# Patient Record
Sex: Male | Born: 1970 | Race: White | Hispanic: No | Marital: Single | Smoking: Current every day smoker
Health system: Southern US, Community
[De-identification: ages and names within clinical notes are randomized; demographics above are authoritative.]

---

## 2016-05-29 ENCOUNTER — Emergency Department
Admission: EM | Admit: 2016-05-29 | Discharge: 2016-05-29 | Disposition: A | Discharge status: Home / Self Care | Payer: Self-pay | Attending: Emergency Medicine | Admitting: Emergency Medicine

## 2016-05-29 ENCOUNTER — Encounter: Payer: Self-pay | Admitting: *Deleted

## 2016-05-29 ENCOUNTER — Emergency Department: Payer: Self-pay

## 2016-05-29 DIAGNOSIS — L03115 Cellulitis of right lower limb: Secondary | ICD-10-CM | POA: Insufficient documentation

## 2016-05-29 DIAGNOSIS — F17200 Nicotine dependence, unspecified, uncomplicated: Secondary | ICD-10-CM | POA: Insufficient documentation

## 2016-05-29 LAB — CBC WITH DIFFERENTIAL/PLATELET
Basophils Absolute: 0.1 10*3/uL (ref 0–0.1)
Basophils Relative: 1 %
EOS ABS: 0.4 10*3/uL (ref 0–0.7)
EOS PCT: 5 %
HCT: 43.6 % (ref 40.0–52.0)
HEMOGLOBIN: 15.2 g/dL (ref 13.0–18.0)
LYMPHS ABS: 2.5 10*3/uL (ref 1.0–3.6)
Lymphocytes Relative: 28 %
MCH: 32.6 pg (ref 26.0–34.0)
MCHC: 34.9 g/dL (ref 32.0–36.0)
MCV: 93.2 fL (ref 80.0–100.0)
MONOS PCT: 6 %
Monocytes Absolute: 0.5 10*3/uL (ref 0.2–1.0)
Neutro Abs: 5.5 10*3/uL (ref 1.4–6.5)
Neutrophils Relative %: 60 %
Platelets: 222 10*3/uL (ref 150–440)
RBC: 4.68 MIL/uL (ref 4.40–5.90)
RDW: 13.5 % (ref 11.5–14.5)
WBC: 9 10*3/uL (ref 3.8–10.6)

## 2016-05-29 LAB — BASIC METABOLIC PANEL
Anion gap: 7 (ref 5–15)
BUN: 5 mg/dL — AB (ref 6–20)
CHLORIDE: 104 mmol/L (ref 101–111)
CO2: 28 mmol/L (ref 22–32)
CREATININE: 0.99 mg/dL (ref 0.61–1.24)
Calcium: 8.7 mg/dL — ABNORMAL LOW (ref 8.9–10.3)
GFR calc Af Amer: >60 mL/min (ref >60)
GFR calc non Af Amer: >60 mL/min (ref >60)
GLUCOSE: 125 mg/dL — AB (ref 65–99)
Potassium: 3.9 mmol/L (ref 3.5–5.1)
Sodium: 139 mmol/L (ref 135–145)

## 2016-05-29 MED ORDER — CLINDAMYCIN PHOSPHATE 600 MG/50ML IV SOLN
600.0000 mg | Freq: Once | INTRAVENOUS | Status: AC
  Administered 2016-05-29: 600 mg via INTRAVENOUS
  Filled 2016-05-29: qty 50

## 2016-05-29 MED ORDER — NAPROXEN 500 MG PO TBEC: 500.0000 mg | DELAYED_RELEASE_TABLET | Freq: Two times a day (BID) | ORAL | 0 refills | Status: AC

## 2016-05-29 MED ORDER — KETOROLAC TROMETHAMINE 30 MG/ML IJ SOLN
30.0000 mg | Freq: Once | INTRAMUSCULAR | Status: AC
  Administered 2016-05-29: 30 mg via INTRAVENOUS
  Filled 2016-05-29: qty 1

## 2016-05-29 MED ORDER — CLINDAMYCIN HCL 300 MG PO CAPS: 300.0000 mg | ORAL_CAPSULE | Freq: Four times a day (QID) | ORAL | 0 refills | Status: AC

## 2016-05-29 MED ORDER — TRAMADOL HCL 50 MG PO TABS: 50.0000 mg | ORAL_TABLET | Freq: Three times a day (TID) | ORAL | 0 refills | Status: AC | PRN

## 2016-05-29 NOTE — Discharge Instructions (Signed)
Keep the leg elevated when seated. Apply cool compresses to reduce pain and swelling. Take the antibiotic as directed until completely gone. Return to the ED for signs of worsening infection.

## 2016-05-29 NOTE — ED Provider Notes (Signed)
[facility] Emergency Department Provider Note ____________________________________________  Time seen: 1813  I have reviewed the triage vital signs and the nursing notes.  HISTORY  Chief Complaint  Leg Pain  HPI David Murillo is a 45 y.o. male presents to the ED for evaluation of about 2-1/2 weeks of increased right lower extremity swelling distally. The patient denies any recent injury, accident, or trauma. He has reported remote severe dog bite to the right lower extremity about 4 years prior. He describes his hospital course included IV infusion over a 10 day admission. He denies any interim problems, except some posterior calf nerve injury. Over the last 2 and half weeks the patient describes increased swelling to the lower leg with walking a few blocks from his house to the local store. He denies any fevers, chills, sweats. He does note some mild swelling to the right lower extremity as well as some slight redness to the lower leg. He reports his tenderness is most acute over his previous wound. He has not had any previous episodes like this similarly. He reports that at rest his pain is about 2 out of 10. His symptoms are increased with prolonged walking and standing.  No past medical history on file.  There are no active problems to display for this patient.  No past surgical history on file.  Prior to Admission medications   Medication Sig Start Date End Date Taking? Authorizing Provider  clindamycin (CLEOCIN) 300 MG capsule Take 1 capsule (300 mg total) by mouth 4 (four) times daily. 05/29/16   Harbor Paster V Bacon Kenlei Safi, PA-C  naproxen (EC NAPROSYN) 500 MG EC tablet Take 1 tablet (500 mg total) by mouth 2 (two) times daily with a meal. 05/29/16   Vana Arif V Bacon Olando Willems, PA-C  traMADol (ULTRAM) 50 MG tablet Take 1 tablet (50 mg total) by mouth 3 (three) times daily as needed. 05/29/16   Adolfo Granieri V Bacon Trisha Morandi, PA-C   Allergies Review of patient's allergies  indicates no known allergies.  No family history on file.  Social History Social History  Substance Use Topics  . Smoking status: Current Every Day Smoker  . Smokeless tobacco: Never Used  . Alcohol use Yes   Review of Systems  Constitutional: Negative for fever. Cardiovascular: Negative for chest pain. Respiratory: Negative for shortness of breath. Gastrointestinal: Negative for abdominal pain, vomiting and diarrhea. Musculoskeletal: Negative for back pain. Skin: Negative for rash. RLE redness, tenderness, and swelling as above.  Neurological: Negative for headaches, focal weakness or numbness. ____________________________________________  PHYSICAL EXAM:  VITAL SIGNS: ED Triage Vitals  Enc Vitals Group     BP 05/29/16 1751 128/84     Pulse Rate 05/29/16 1751 66     Resp 05/29/16 1751 18     Temp 05/29/16 1751 98.3 F (36.8 C)     Temp Source 05/29/16 1751 Oral     SpO2 05/29/16 1751 98 %     Weight 05/29/16 1752 156 lb (70.8 kg)     Height 05/29/16 1752 5\' 8"  (1.727 m)     Head Circumference --      Peak Flow --      Pain Score 05/29/16 1752 0     Pain Loc --      Pain Edu? --      Excl. in GC? --    Constitutional: Alert and oriented. Well appearing and in no distress. Head: Normocephalic and atraumatic. Cardiovascular: Normal rate, regular rhythm. Normal distal pulses and cap refill  Respiratory: Normal respiratory effort.  Musculoskeletal: Nontender with normal range of motion in all extremities.  Neurologic:  Normal gait without ataxia. Normal speech and language. No gross focal neurologic deficits are appreciated. Skin:  Skin is warm, dry and intact. No rash noted. RLE with distal erythema overlying the anterior and lateral portion of the distal 3rd of the leg. Some mild fluctuance to the well-healed scar. No focal induration, pointing, or wound dehiscence. Superficial erythema noted measuring about 15 x 6 cm.  ____________________________________________    LABS (pertinent positives/negatives) Labs Reviewed  BASIC METABOLIC PANEL - Abnormal; Notable for the following:       Result Value   Glucose, Bld 125 (*)    BUN 5 (*)    Calcium 8.7 (*)    All other components within normal limits  CBC WITH DIFFERENTIAL/PLATELET  ____________________________________________   RADIOLOGY  RLE Ultrasound Doppler  IMPRESSION: No evidence of deep venous thrombosis. Soft tissue edema in the right lower leg corresponding to palpable area with pain.  Other Findings: Soft tissue edema is demonstrated in the right lower leg corresponding to palpable area with pain. No loculated collections are visualized. ____________________________________________  PROCEDURES  Clindamycin 300 mg IVP Toradol 30 mg IV ____________________________________________  INITIAL IMPRESSION / ASSESSMENT AND PLAN / ED COURSE  Patient with what appears to be a superficial cellulitis to the right lower extremity without appreciable focal abscess formation. His labs are reassuring and his ultrasound otherwise is negative for DVT or any demonstrable collection of abscess. The patient is discharged with a prescription for clindamycin, Ultram, and EC Naprosyn. Return precautions are reviewed and he is advised to follow-up as necessary. He will rest with the leg elevated and return to the ED or with local community clinics for routine wound check as necessary.  Clinical Course   ____________________________________________  FINAL CLINICAL IMPRESSION(S) / ED DIAGNOSES  Final diagnoses:  Cellulitis of right lower extremity      Lissa HoardJenise V Bacon Alizaya Oshea, PA-C 05/29/16 2228    Minna AntisKevin Paduchowski, MD 05/29/16 2330

## 2016-05-29 NOTE — ED Triage Notes (Signed)
Pt has pain in right lower leg for 2 weeks.  No known recent injury . Pt had a dogbite 4 years ago and now has pain and swelling to the area of right lower leg.

## 2018-03-13 IMAGING — US US EXTREM LOW VENOUS*R*
1 series · 13 of 24 positions shown · non-contrast
Comparison: None.

CLINICAL DATA: Right lower extremity swelling and erythema. No
recent trauma. Dog bite 4 years ago. Pain and edema for 2.5 weeks.
Color changes and ulcerations.



[Series 1: us extrem low venous*right* · 0.05mm/px · 13 of 43 slices shown]
[im 1/43]
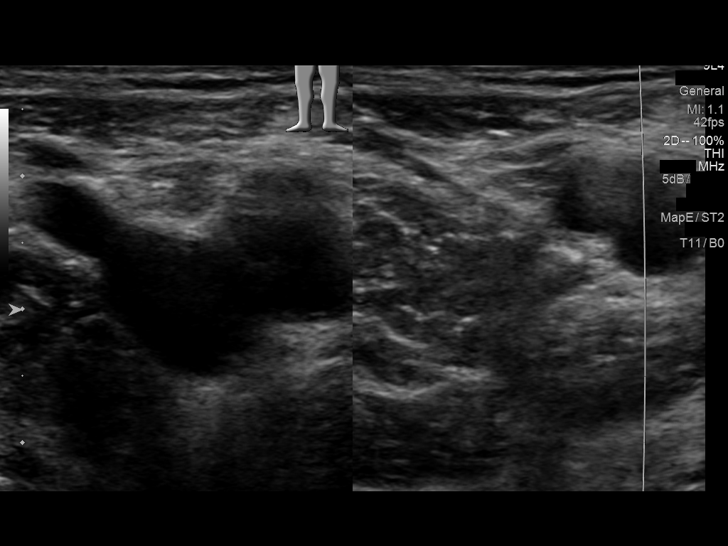
[im 4/43]
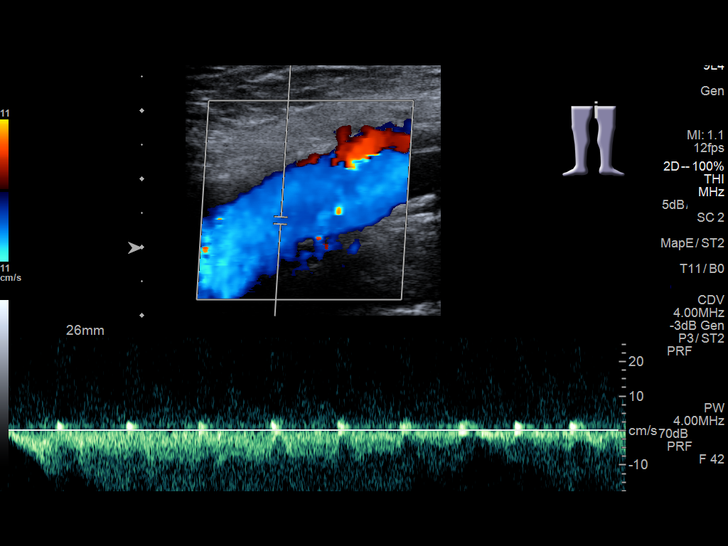
[im 8/43]
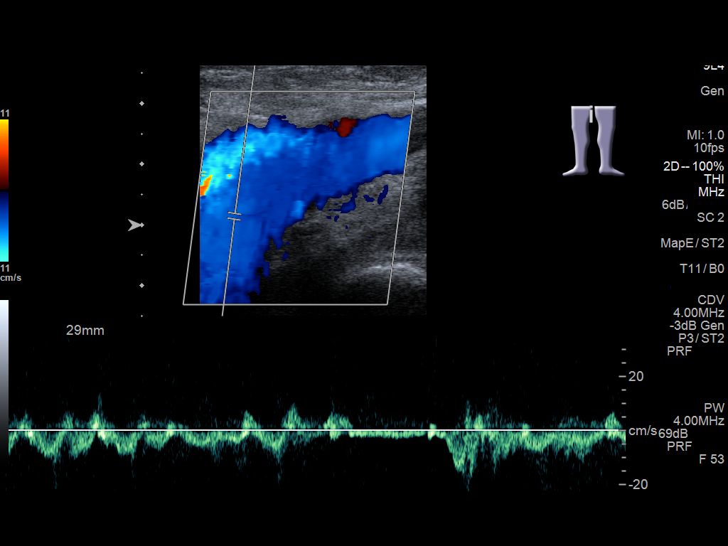
[im 11/43]
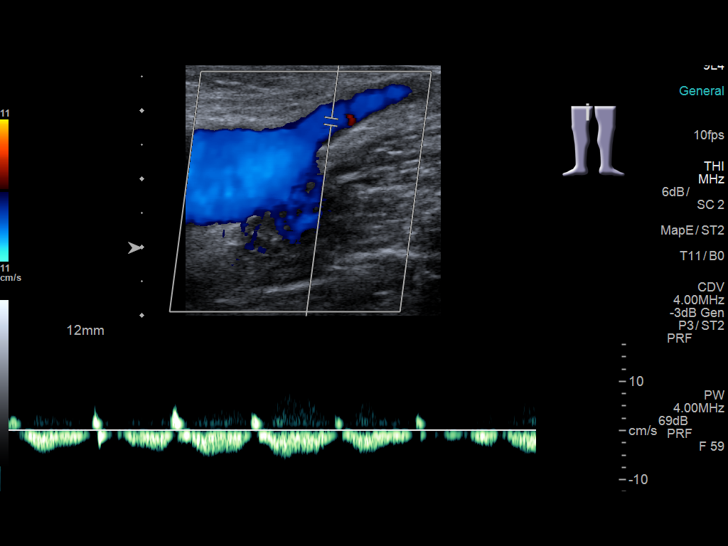
[im 15/43]
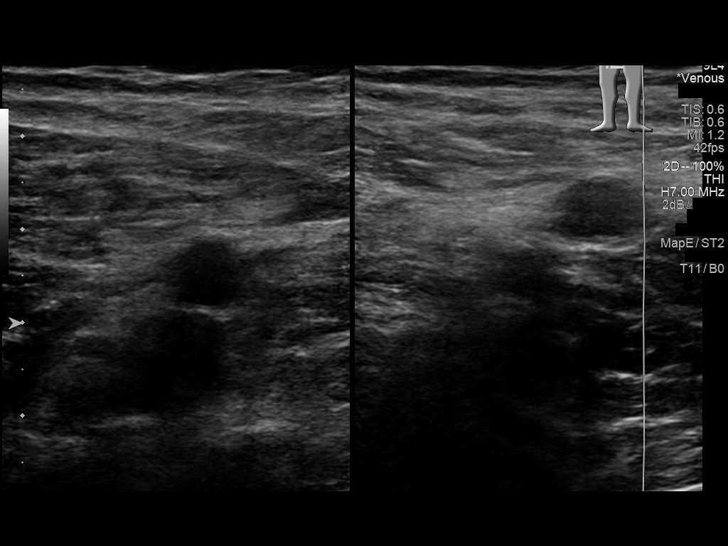
[im 19/43]
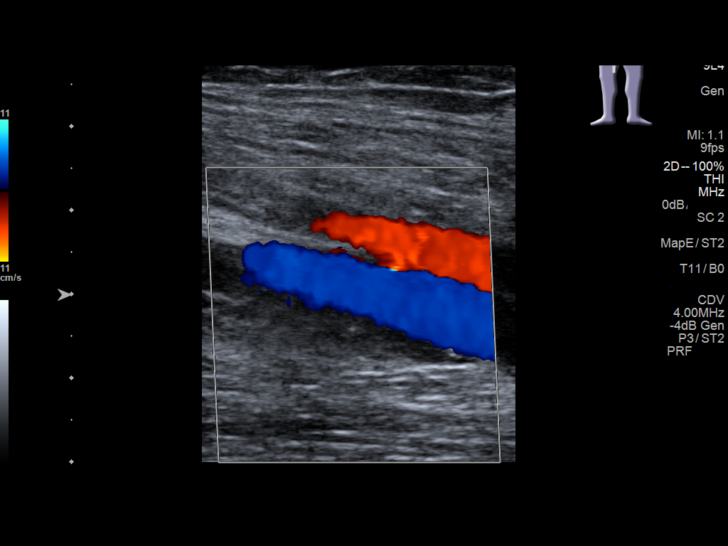
[im 22/43]
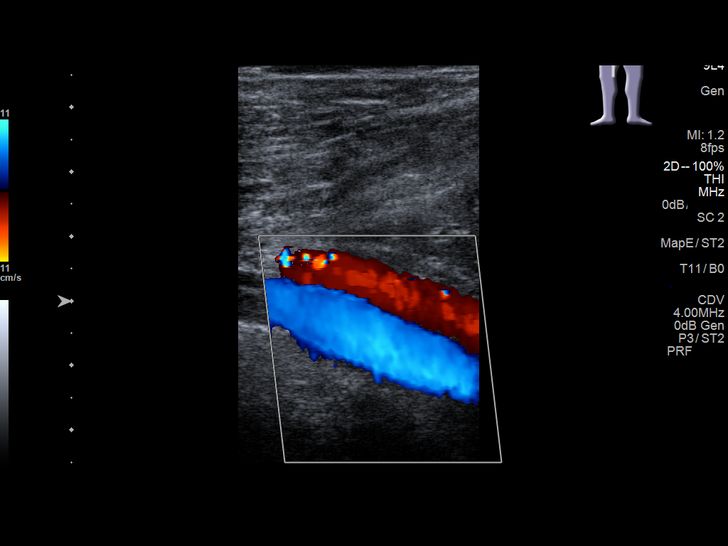
[im 24/43]
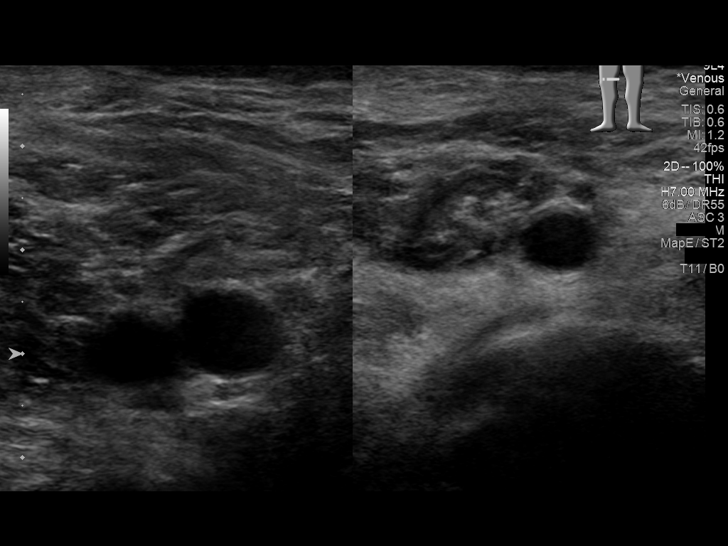
[im 28/43]
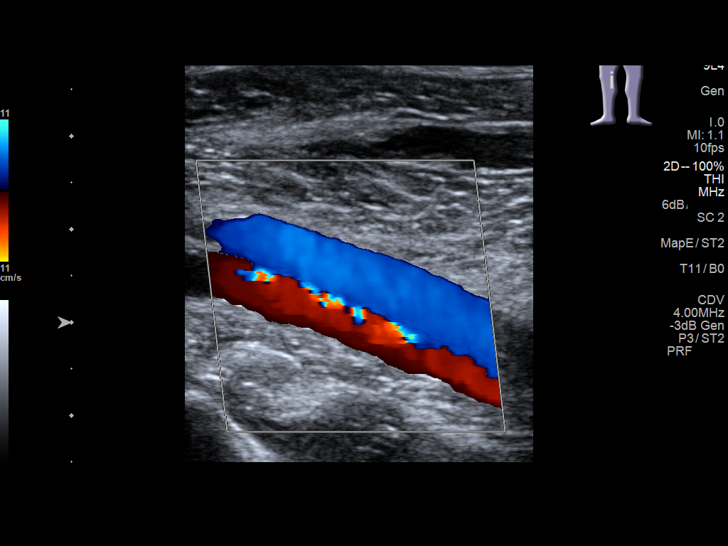
[im 32/43]
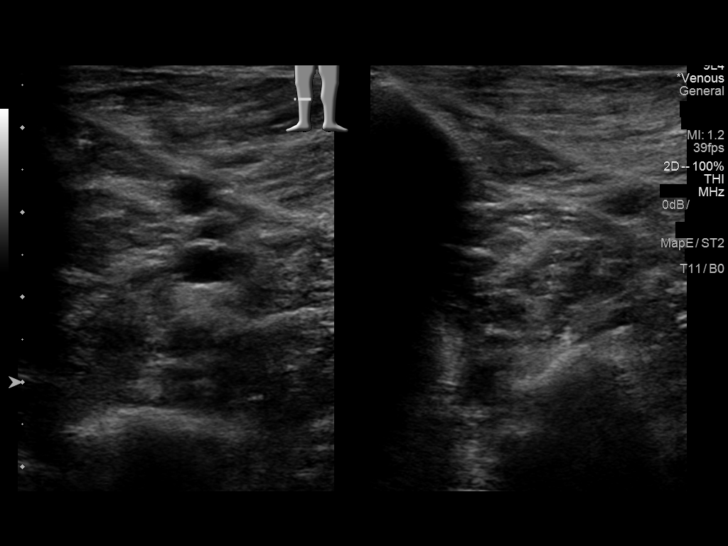
[im 35/43]
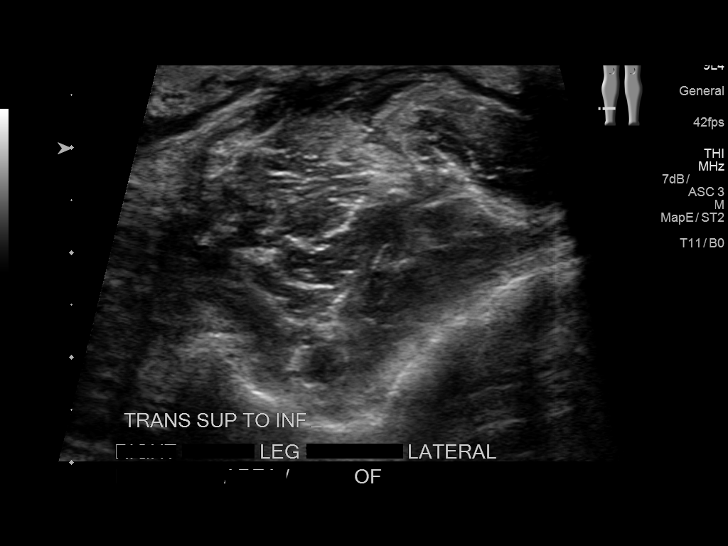
[im 39/43]
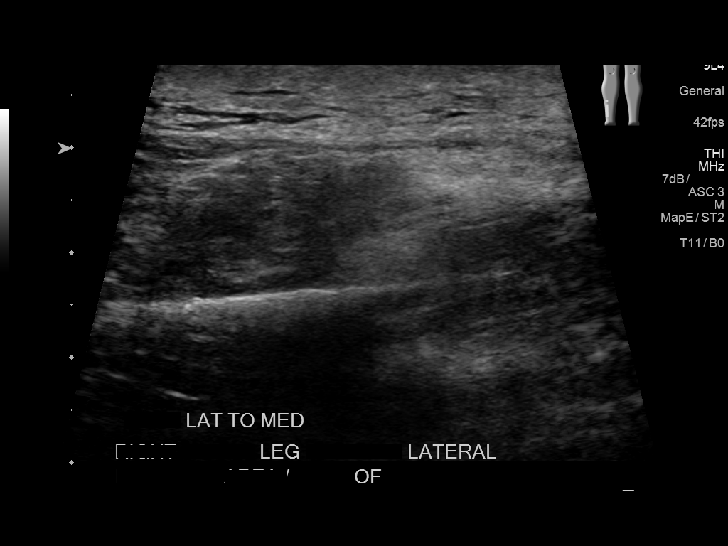
[im 43/43]
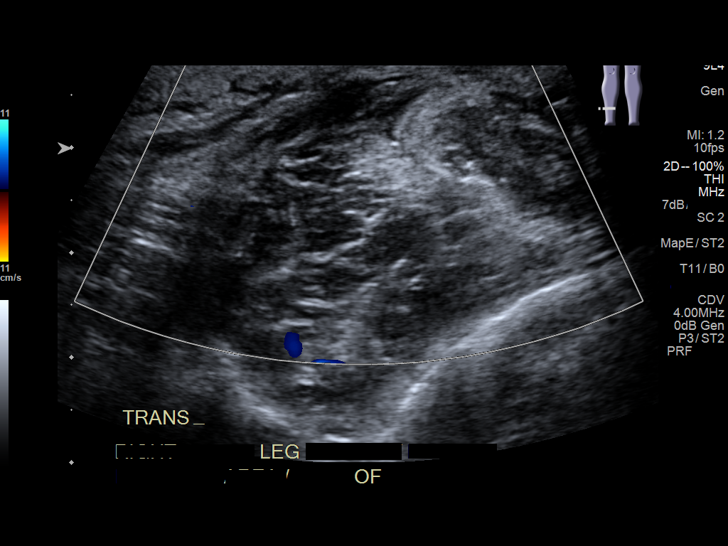

[13 of 24 positions shown; findings below may reference images not displayed]

FINDINGS: Contralateral Common Femoral Vein: Respiratory phasicity is normal
and symmetric with the symptomatic side. No evidence of thrombus.
Normal compressibility.

Common Femoral Vein: No evidence of thrombus. Normal
compressibility, respiratory phasicity and response to augmentation.

Saphenofemoral Junction: No evidence of thrombus. Normal
compressibility and flow on color Doppler imaging.

Profunda Femoral Vein: No evidence of thrombus. Normal
compressibility and flow on color Doppler imaging.

Femoral Vein: No evidence of thrombus. Normal compressibility,
respiratory phasicity and response to augmentation.

Popliteal Vein: No evidence of thrombus. Normal compressibility,
respiratory phasicity and response to augmentation.

Calf Veins: No evidence of thrombus. Normal compressibility and flow
on color Doppler imaging.

Superficial Great Saphenous Vein: No evidence of thrombus. Normal
compressibility and flow on color Doppler imaging.

Venous Reflux:  None.

Other Findings: Soft tissue edema is demonstrated in the right lower
leg corresponding to palpable area with pain. No loculated
collections are visualized.
IMPRESSION: No evidence of deep venous thrombosis. Soft tissue edema in the
right lower leg corresponding to palpable area with pain.
